# Patient Record
Sex: Male | Born: 1935 | Race: White | Hispanic: No | Marital: Married | Smoking: Never smoker
Health system: Southern US, Community
[De-identification: ages and names within clinical notes are randomized; demographics above are authoritative.]

## PROBLEM LIST (undated history)

## (undated) DIAGNOSIS — I2510 Atherosclerotic heart disease of native coronary artery without angina pectoris: Secondary | ICD-10-CM

## (undated) DIAGNOSIS — C801 Malignant (primary) neoplasm, unspecified: Secondary | ICD-10-CM

## (undated) DIAGNOSIS — I639 Cerebral infarction, unspecified: Secondary | ICD-10-CM

## (undated) DIAGNOSIS — I219 Acute myocardial infarction, unspecified: Secondary | ICD-10-CM

## (undated) HISTORY — PX: CHOLECYSTECTOMY: SHX55

## (undated) HISTORY — PX: COLON SURGERY: SHX602

## (undated) HISTORY — PX: REPLACEMENT TOTAL KNEE: SUR1224

---

## 2012-07-17 ENCOUNTER — Encounter (HOSPITAL_BASED_OUTPATIENT_CLINIC_OR_DEPARTMENT_OTHER): Payer: Self-pay | Admitting: *Deleted

## 2012-07-17 ENCOUNTER — Emergency Department (HOSPITAL_BASED_OUTPATIENT_CLINIC_OR_DEPARTMENT_OTHER)
Admission: EM | Admit: 2012-07-17 | Discharge: 2012-07-17 | Disposition: A | Discharge status: Home / Self Care | Payer: Medicare Other | Attending: Emergency Medicine | Admitting: Emergency Medicine

## 2012-07-17 ENCOUNTER — Emergency Department (HOSPITAL_BASED_OUTPATIENT_CLINIC_OR_DEPARTMENT_OTHER): Payer: Medicare Other

## 2012-07-17 DIAGNOSIS — I252 Old myocardial infarction: Secondary | ICD-10-CM | POA: Insufficient documentation

## 2012-07-17 DIAGNOSIS — W1809XA Striking against other object with subsequent fall, initial encounter: Secondary | ICD-10-CM | POA: Insufficient documentation

## 2012-07-17 DIAGNOSIS — R059 Cough, unspecified: Secondary | ICD-10-CM | POA: Insufficient documentation

## 2012-07-17 DIAGNOSIS — S81809A Unspecified open wound, unspecified lower leg, initial encounter: Secondary | ICD-10-CM | POA: Insufficient documentation

## 2012-07-17 DIAGNOSIS — S81011A Laceration without foreign body, right knee, initial encounter: Secondary | ICD-10-CM

## 2012-07-17 DIAGNOSIS — Z8673 Personal history of transient ischemic attack (TIA), and cerebral infarction without residual deficits: Secondary | ICD-10-CM | POA: Insufficient documentation

## 2012-07-17 DIAGNOSIS — Y92009 Unspecified place in unspecified non-institutional (private) residence as the place of occurrence of the external cause: Secondary | ICD-10-CM | POA: Insufficient documentation

## 2012-07-17 DIAGNOSIS — Z79899 Other long term (current) drug therapy: Secondary | ICD-10-CM | POA: Insufficient documentation

## 2012-07-17 DIAGNOSIS — Z859 Personal history of malignant neoplasm, unspecified: Secondary | ICD-10-CM | POA: Insufficient documentation

## 2012-07-17 DIAGNOSIS — Z96659 Presence of unspecified artificial knee joint: Secondary | ICD-10-CM | POA: Insufficient documentation

## 2012-07-17 DIAGNOSIS — R05 Cough: Secondary | ICD-10-CM

## 2012-07-17 DIAGNOSIS — Y9389 Activity, other specified: Secondary | ICD-10-CM | POA: Insufficient documentation

## 2012-07-17 DIAGNOSIS — S81009A Unspecified open wound, unspecified knee, initial encounter: Secondary | ICD-10-CM | POA: Insufficient documentation

## 2012-07-17 DIAGNOSIS — Z7902 Long term (current) use of antithrombotics/antiplatelets: Secondary | ICD-10-CM | POA: Insufficient documentation

## 2012-07-17 DIAGNOSIS — I2510 Atherosclerotic heart disease of native coronary artery without angina pectoris: Secondary | ICD-10-CM | POA: Insufficient documentation

## 2012-07-17 HISTORY — DX: Atherosclerotic heart disease of native coronary artery without angina pectoris: I25.10

## 2012-07-17 HISTORY — DX: Malignant (primary) neoplasm, unspecified: C80.1

## 2012-07-17 HISTORY — DX: Acute myocardial infarction, unspecified: I21.9

## 2012-07-17 HISTORY — DX: Cerebral infarction, unspecified: I63.9

## 2012-07-17 MED ORDER — HYDROCODONE-ACETAMINOPHEN 5-325 MG PO TABS: 2.0000 | ORAL_TABLET | Freq: Four times a day (QID) | ORAL | Status: DC | PRN

## 2012-07-17 MED ORDER — HYDROCODONE-ACETAMINOPHEN 5-325 MG PO TABS
1.0000 | ORAL_TABLET | Freq: Once | ORAL | Status: AC
  Administered 2012-07-17: 1 via ORAL
  Filled 2012-07-17: qty 1

## 2012-07-17 NOTE — ED Notes (Signed)
Pt resting comfortably. Waiting for xray. Bacitracin ointment applied to superficial wounds on hands and lower ext.

## 2012-07-17 NOTE — ED Notes (Signed)
Pt states he stumped his toe and fell. Now C/O bilateral knee pain. Lac to right knee. Abrasions to hands and legs. No LOC PERRL.

## 2012-07-17 NOTE — ED Notes (Signed)
Dr. Bernette Mayers suturing pt.

## 2012-07-17 NOTE — ED Provider Notes (Signed)
History  This chart was scribed for Charles B. Bernette Mayers, MD by Manuela Schwartz, ED scribe. This patient was seen in room MH07/MH07 and the patient's care was started at 1822.   CSN: [id number]  Arrival date & time 07/17/12  Rickey Primus   First MD Initiated Contact with Patient 07/17/12 1930      Chief Complaint  Patient presents with  . Fall   Patient is a 79 y.o. male presenting with fall. The history is provided by the patient. No language interpreter was used.  Fall This is a new problem. The current episode started 1 to 2 hours ago. The problem has not changed since onset.Pertinent negatives include no chest pain and no shortness of breath. Nothing aggravates the symptoms. Nothing relieves the symptoms. He has tried nothing for the symptoms.   HPI Comments: Gohan Collister is a 79 y.o. male who presents to the Emergency Department complaining of a laceration to his right knee after he fell on concrete at home just PTA. He states constant moderate pain to his right knee and denies any other injuries and no LOC. He has not tried any medicines at home for this problem. He states minimal bleeding and is able to bear weight on both legs.  Pt has also had cough for the last several days and family is requesting CXR.   Past Medical History  Diagnosis Date  . Coronary artery disease   . Myocardial infarct   . Stroke   . Cancer     Past Surgical History  Procedure Laterality Date  . Replacement total knee    . Colon surgery      History reviewed. No pertinent family history.  History  Substance Use Topics  . Smoking status: Never Smoker   . Smokeless tobacco: Not on file  . Alcohol Use: No      Review of Systems  Constitutional: Negative for fever and chills.  Respiratory: Negative for shortness of breath.   Cardiovascular: Negative for chest pain.  Gastrointestinal: Negative for nausea and vomiting.  Skin: Positive for wound (laceration right anterior knee).  Neurological: Negative  for dizziness, syncope and weakness.  All other systems reviewed and are negative.   A complete 10 system review of systems was obtained and all systems are negative except as noted in the HPI and PMH.   Allergies  Review of patient's allergies indicates no known allergies.  Home Medications   Current Outpatient Rx  Name  Route  Sig  Dispense  Refill  . clopidogrel (PLAVIX) 75 MG tablet   Oral   Take 75 mg by mouth daily.         Marland Kitchen escitalopram (LEXAPRO) 10 MG tablet   Oral   Take 10 mg by mouth daily.         Marland Kitchen HYDROcodone-acetaminophen (NORCO/VICODIN) 5-325 MG per tablet   Oral   Take 1 tablet by mouth every 6 (six) hours as needed for pain.           Triage Vitals: BP 118/67  Pulse 80  Temp(Src) 98.3 F (36.8 C) (Oral)  Resp 20  Ht 6' (1.829 m)  Wt 222 lb (100.699 kg)  BMI 30.1 kg/m2  SpO2 94%  Physical Exam  Nursing note and vitals reviewed. Constitutional: He is oriented to person, place, and time. He appears well-developed and well-nourished.  HENT:  Head: Normocephalic and atraumatic.  Eyes: EOM are normal. Pupils are equal, round, and reactive to light.  Neck: Normal range of motion. Neck  supple.  Cardiovascular: Normal rate, normal heart sounds and intact distal pulses.   Pulmonary/Chest: Effort normal.  Decreased breath sounds right base  Abdominal: Bowel sounds are normal. He exhibits no distension. There is no tenderness.  Musculoskeletal: Normal range of motion. He exhibits no edema and no tenderness.  Neurological: He is alert and oriented to person, place, and time. He has normal strength. No cranial nerve deficit or sensory deficit.  Skin: Skin is warm and dry. No rash noted.  6 cm laceration to anterior right knee  Psychiatric: He has a normal mood and affect.    ED Course  Procedures (including critical care time) DIAGNOSTIC STUDIES: Oxygen Saturation is 94% on room air, adequate by my interpretation.    LACERATION REPAIR Performed  by: Pollyann Savoy. Consent: Verbal consent obtained. Risks and benefits: risks, benefits and alternatives were discussed Patient identity confirmed: provided demographic data Time out performed prior to procedure Prepped and Draped in normal sterile fashion Wound explored Laceration Location: R knee Laceration Length: 6cm No Foreign Bodies seen or palpated Anesthesia: local infiltration Local anesthetic: lidocaine 2% no epinephrine Anesthetic total: 3 ml Irrigation method: syringe Amount of cleaning: extensive Skin closure: vicryl 5-0 internal x 2 for fascia closure, nylon 4-0 horizontal mattress x 5 for skin closure in high tension site Patient tolerance: Patient tolerated the procedure well with no immediate complications.   COORDINATION OF CARE: At 730 PM Discussed treatment plan with patient which includes pain medicine, laceration repair, CXR, and right knee X-ray. Patient agrees.   Labs Reviewed - No data to display Dg Chest 2 View  07/17/2012   *RADIOLOGY REPORT*  Clinical Data: Cough  CHEST - 2 VIEW  Comparison: None.  Findings: Hyperinflation with prominent lung markings diffusely suggestive of COPD and scarring.  No prior studies are available. Without the benefit of prior studies, it is difficult exclude acute infection however these findings are most likely due to scarring. There is apical pleural scarring bilaterally.  Negative for heart failure or effusion.  Chronic left rib fractures.  IMPRESSION: Findings are most consistent with chronic lung disease and scarring.  No prior studies available to determine baseline in this patient.   Original Report Authenticated By: Janeece Riggers, M.D.   Dg Knee Complete 4 Views Right  07/17/2012   *RADIOLOGY REPORT*  Clinical Data: Fall  RIGHT KNEE - COMPLETE 4+ VIEW  Comparison: None.  Findings: Total knee replacement in satisfactory position and alignment without evidence of loosening  Negative for fracture.  IMPRESSION: No acute  abnormality.   Original Report Authenticated By: Janeece Riggers, M.D.     1. Knee laceration, right, initial encounter   2. Cough       MDM  I personally performed the services described in this documentation, which was scribed in my presence. The recorded information has been reviewed and is accurate.           Charles B. Bernette Mayers, MD 07/17/12 2129

## 2012-07-31 ENCOUNTER — Encounter (HOSPITAL_BASED_OUTPATIENT_CLINIC_OR_DEPARTMENT_OTHER): Payer: Self-pay | Admitting: *Deleted

## 2012-07-31 ENCOUNTER — Emergency Department (HOSPITAL_BASED_OUTPATIENT_CLINIC_OR_DEPARTMENT_OTHER)
Admission: EM | Admit: 2012-07-31 | Discharge: 2012-07-31 | Disposition: A | Discharge status: Home / Self Care | Payer: Medicare Other | Attending: Emergency Medicine | Admitting: Emergency Medicine

## 2012-07-31 DIAGNOSIS — Z8673 Personal history of transient ischemic attack (TIA), and cerebral infarction without residual deficits: Secondary | ICD-10-CM | POA: Insufficient documentation

## 2012-07-31 DIAGNOSIS — I2510 Atherosclerotic heart disease of native coronary artery without angina pectoris: Secondary | ICD-10-CM | POA: Insufficient documentation

## 2012-07-31 DIAGNOSIS — I252 Old myocardial infarction: Secondary | ICD-10-CM | POA: Insufficient documentation

## 2012-07-31 DIAGNOSIS — T8140XA Infection following a procedure, unspecified, initial encounter: Secondary | ICD-10-CM | POA: Insufficient documentation

## 2012-07-31 DIAGNOSIS — Z859 Personal history of malignant neoplasm, unspecified: Secondary | ICD-10-CM | POA: Insufficient documentation

## 2012-07-31 DIAGNOSIS — Z79899 Other long term (current) drug therapy: Secondary | ICD-10-CM | POA: Insufficient documentation

## 2012-07-31 DIAGNOSIS — Z4802 Encounter for removal of sutures: Secondary | ICD-10-CM | POA: Insufficient documentation

## 2012-07-31 DIAGNOSIS — Z96659 Presence of unspecified artificial knee joint: Secondary | ICD-10-CM | POA: Insufficient documentation

## 2012-07-31 DIAGNOSIS — Y849 Medical procedure, unspecified as the cause of abnormal reaction of the patient, or of later complication, without mention of misadventure at the time of the procedure: Secondary | ICD-10-CM | POA: Insufficient documentation

## 2012-07-31 DIAGNOSIS — T798XXA Other early complications of trauma, initial encounter: Secondary | ICD-10-CM

## 2012-07-31 DIAGNOSIS — Z7902 Long term (current) use of antithrombotics/antiplatelets: Secondary | ICD-10-CM | POA: Insufficient documentation

## 2012-07-31 MED ORDER — CEPHALEXIN 500 MG PO CAPS: 500.0000 mg | ORAL_CAPSULE | Freq: Two times a day (BID) | ORAL | Status: DC

## 2012-07-31 NOTE — ED Notes (Signed)
Needs suture removal right knee

## 2012-07-31 NOTE — ED Provider Notes (Signed)
History    CSN: [id number] Arrival date & time 07/31/12  1518  First MD Initiated Contact with Patient 07/31/12 1552     Chief Complaint  Patient presents with  . Suture / Staple Removal   (Consider location/radiation/quality/duration/timing/severity/associated sxs/prior Treatment) HPI Jerry Mcfarland is a 79 y.o. male who presents to ED with complaint of needing suture removal. Pt states he fell 2 wks ago onto right knee. Reports laceration that he had sutured here in ED. Here for reevaluation and suture removal. Admits to mild redness and swelling around the laceration. Denies tenderness, no drainage, no fever, chills. No pain with walking or knee ROM. Pt states he has not been prescribed any antibiotics and has not been putting any antibiotics on.   Past Medical History  Diagnosis Date  . Coronary artery disease   . Myocardial infarct   . Stroke   . Cancer    Past Surgical History  Procedure Laterality Date  . Replacement total knee    . Colon surgery     No family history on file. History  Substance Use Topics  . Smoking status: Never Smoker   . Smokeless tobacco: Not on file  . Alcohol Use: No    Review of Systems  Constitutional: Negative for fever and chills.  Skin: Positive for color change and wound.  Neurological: Negative for weakness and numbness.    Allergies  Review of patient's allergies indicates no known allergies.  Home Medications   Current Outpatient Rx  Name  Route  Sig  Dispense  Refill  . clopidogrel (PLAVIX) 75 MG tablet   Oral   Take 75 mg by mouth daily.         Marland Kitchen escitalopram (LEXAPRO) 10 MG tablet   Oral   Take 10 mg by mouth daily.         Marland Kitchen HYDROcodone-acetaminophen (NORCO/VICODIN) 5-325 MG per tablet   Oral   Take 1 tablet by mouth every 6 (six) hours as needed for pain.         Marland Kitchen HYDROcodone-acetaminophen (NORCO/VICODIN) 5-325 MG per tablet   Oral   Take 2 tablets by mouth every 6 (six) hours as needed for pain.  30 tablet   0    BP 100/57  Pulse 110  Temp(Src) 98.3 F (36.8 C) (Oral)  Resp 16  Ht 6' (1.829 m)  Wt 222 lb (100.699 kg)  BMI 30.1 kg/m2  SpO2 96% Physical Exam  Nursing note and vitals reviewed. Constitutional: He appears well-developed and well-nourished. No distress.  HENT:  Head: Normocephalic.  Eyes: Conjunctivae are normal. Pupils are equal, round, and reactive to light.  Pulmonary/Chest: Effort normal.  Musculoskeletal:  Full rom of right knee  Skin: Skin is warm and dry.  Healing laceration to anterior knee with scabbing. Surrounding erythema is present. No drainage, no dehiscence, no tenderness    ED Course  Procedures (including critical care time) Labs Reviewed - No data to display No results found. No diagnosis found.  MDM  Pt with right knee laceration 14 days ago, healing well. Suture removed, 5. Suspicious for possible mild surrounding cellulitis given erythema. NO pain with knee rom doubt septic joint. Will start on keflex. Follow up with pcp.   Filed Vitals:   07/31/12 1550 07/31/12 1656  BP: 100/57 107/71  Pulse: 110 107  Temp: 98.3 F (36.8 C) 98.3 F (36.8 C)  TempSrc: Oral Oral  Resp: 16 16  Height: 6' (1.829 m)   Weight: 222 lb (100.699  kg)   SpO2: 96% 94%     Lottie Mussel, PA-C 08/01/12 0028

## 2012-08-02 NOTE — ED Provider Notes (Signed)
Medical screening examination/treatment/procedure(s) were performed by non-physician practitioner and as supervising physician I was immediately available for consultation/collaboration.   Rolan Bucco, MD 08/02/12 [phone]

## 2014-11-07 IMAGING — CR DG CHEST 2V
2 series · 2 of 2 positions shown · non-contrast
Comparison: None.

CLINICAL DATA: Cough

CHEST - 2 VIEW

[w chest pa]
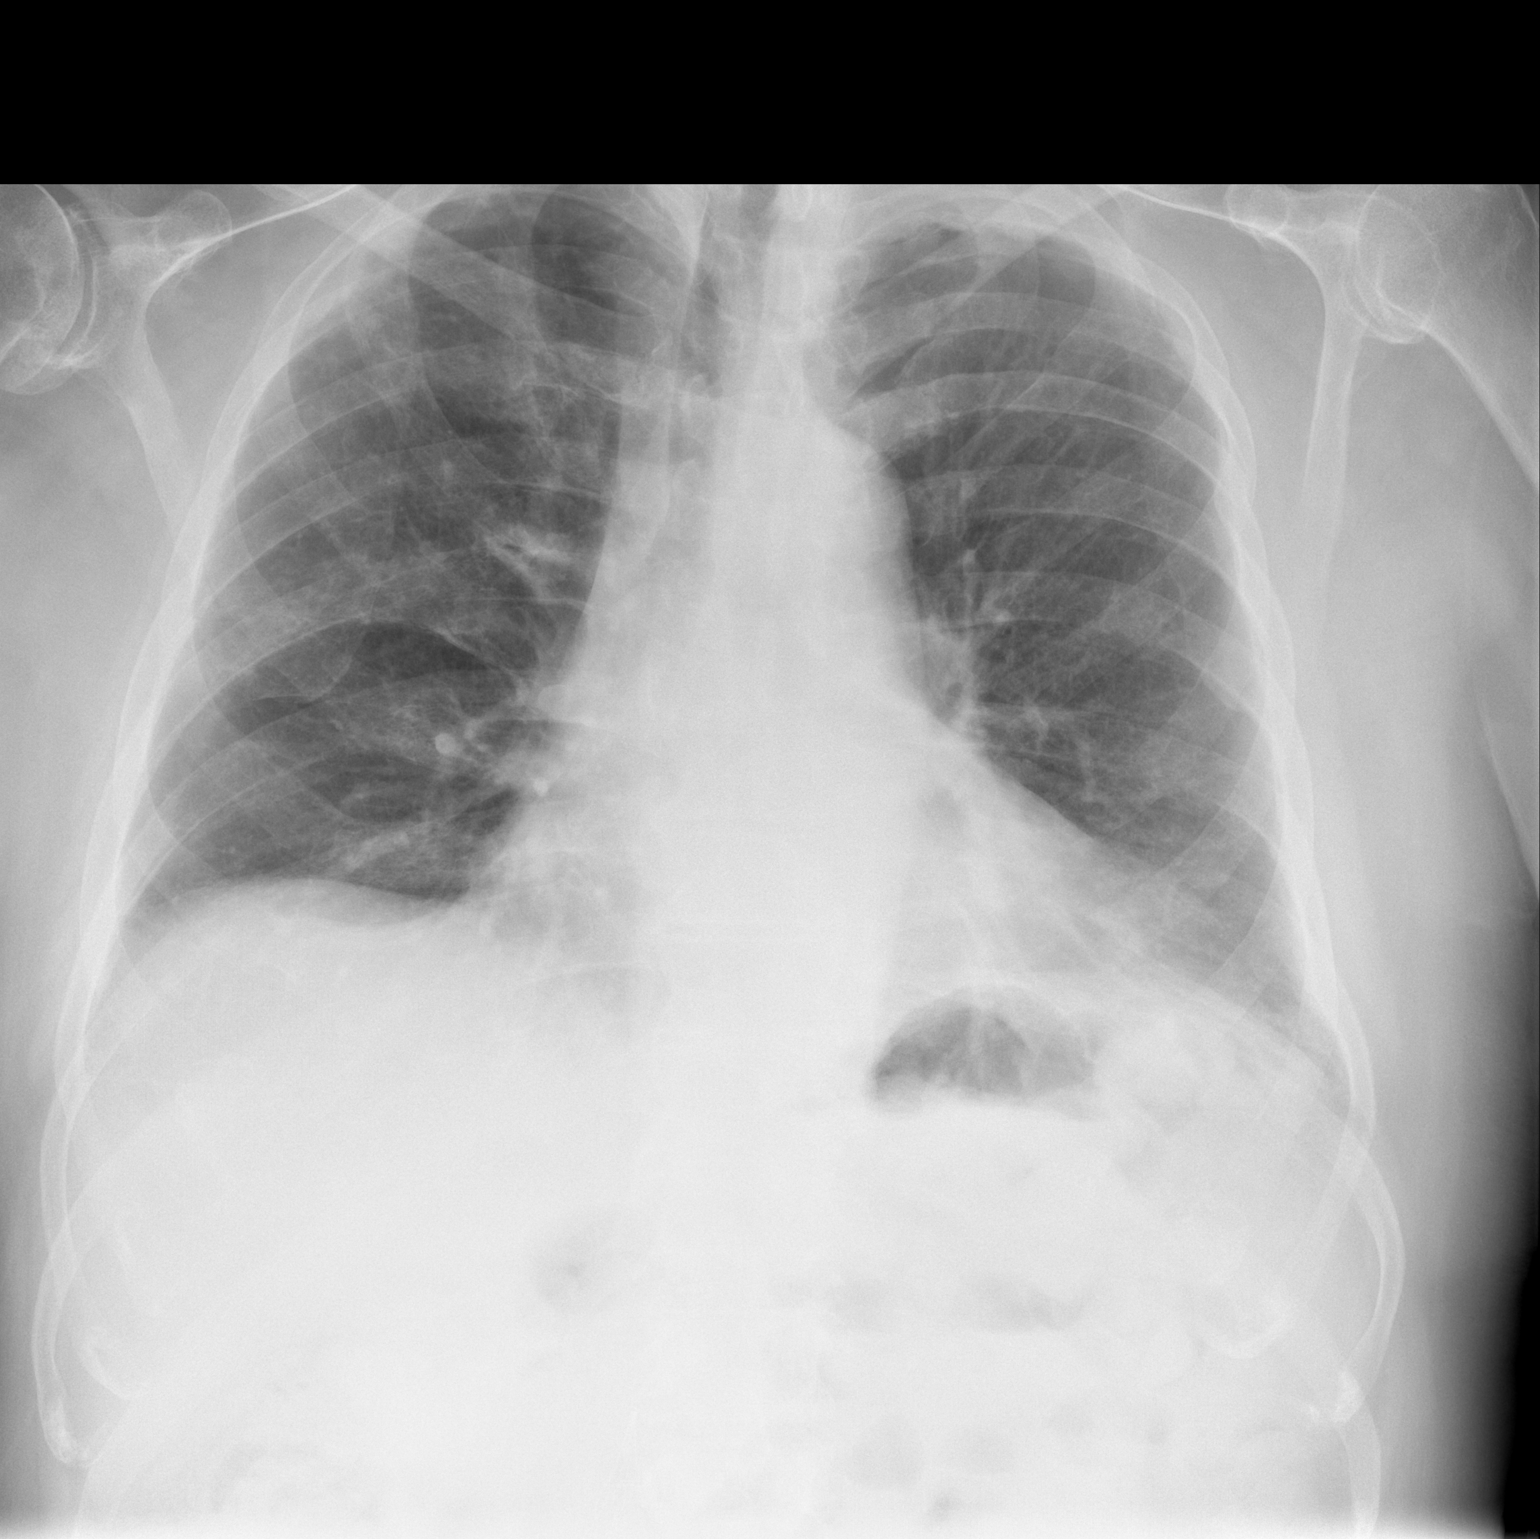

[w chest lat]
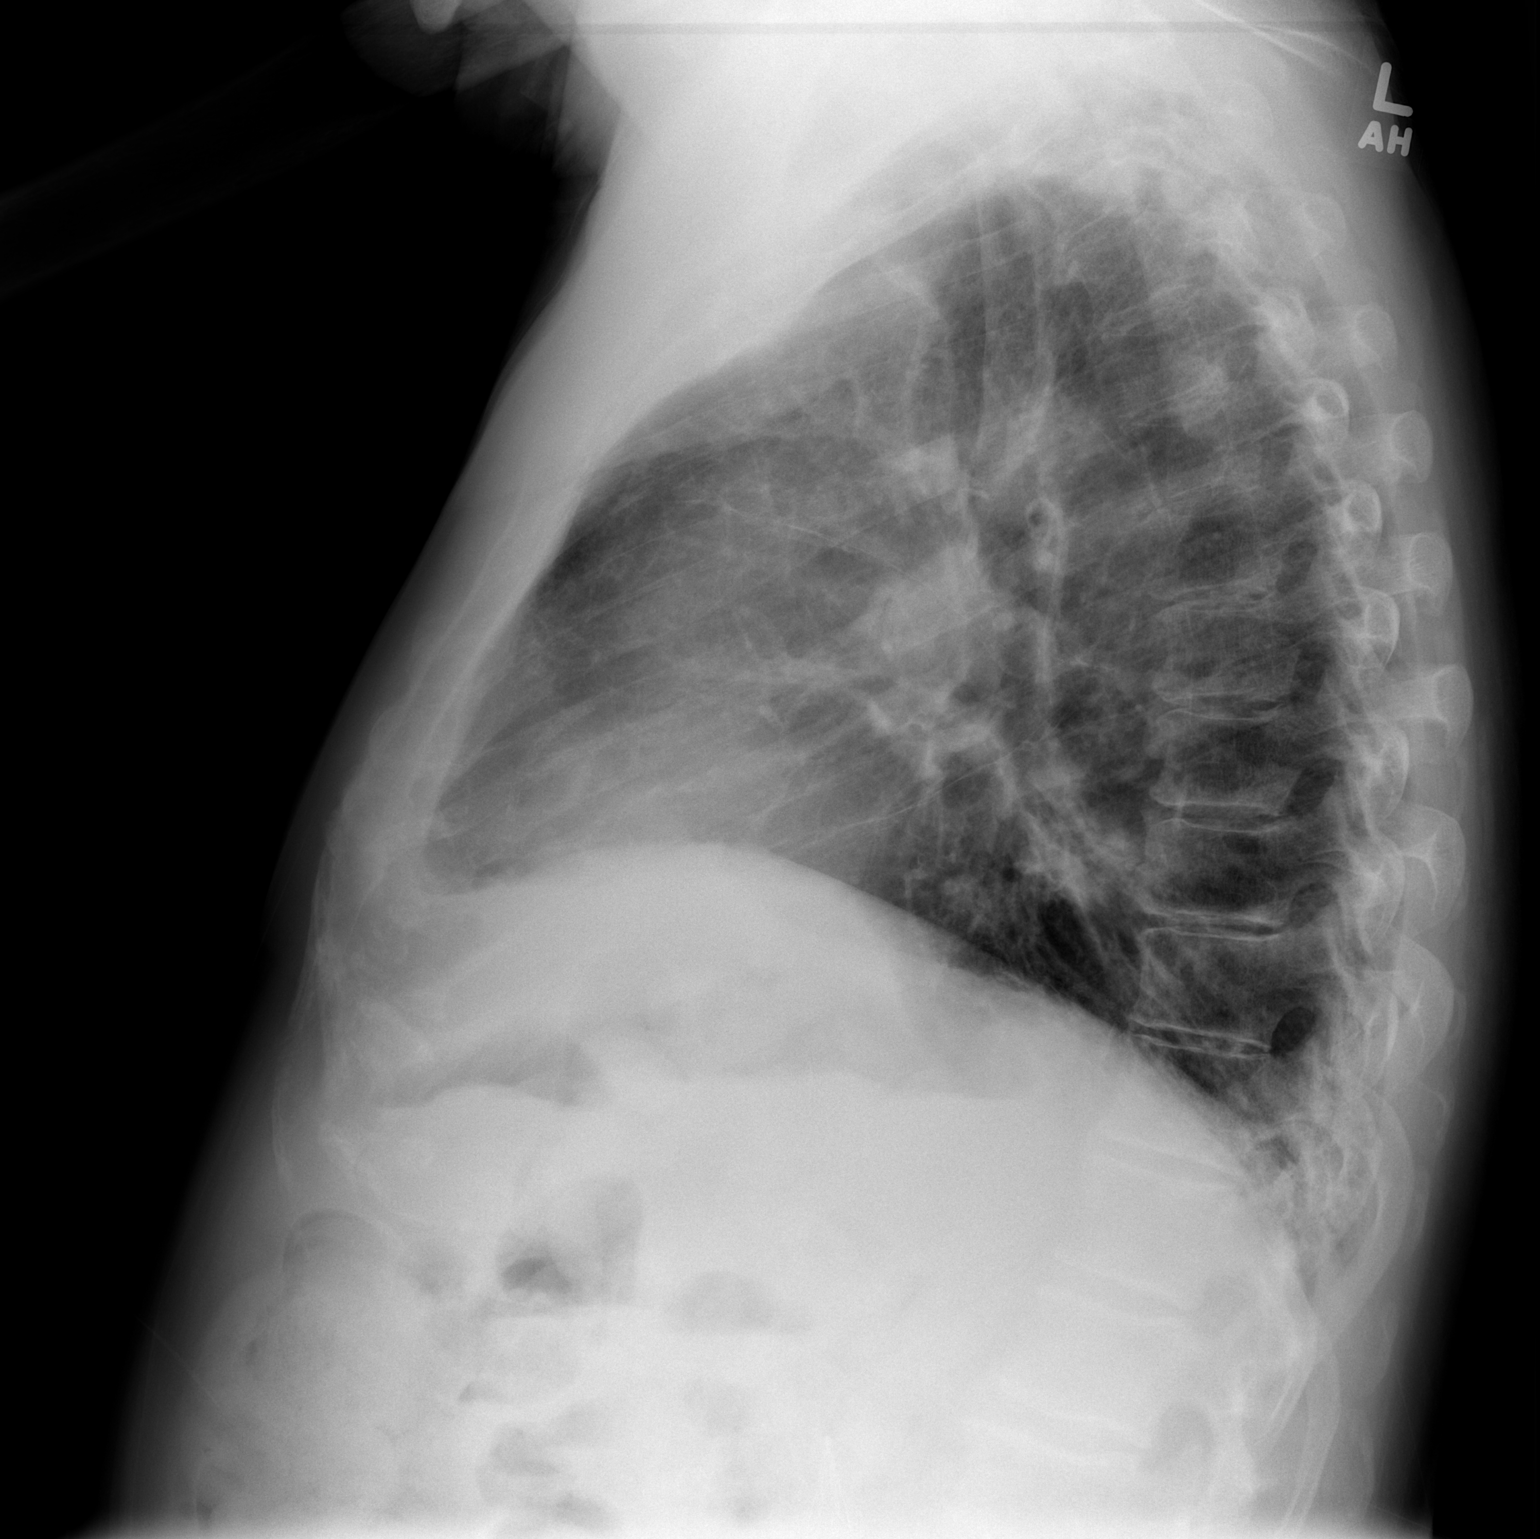

[2 of 2 positions shown; findings below may reference images not displayed]

FINDINGS: Hyperinflation with prominent lung markings diffusely
suggestive of COPD and scarring.  No prior studies are available.
Without the benefit of prior studies, it is difficult exclude acute
infection however these findings are most likely due to scarring.
There is apical pleural scarring bilaterally.  Negative for heart
failure or effusion.

Chronic left rib fractures.
IMPRESSION: Findings are most consistent with chronic lung disease and
scarring.  No prior studies available to determine baseline in this
patient.

## 2015-07-14 ENCOUNTER — Encounter (HOSPITAL_BASED_OUTPATIENT_CLINIC_OR_DEPARTMENT_OTHER): Payer: Self-pay | Admitting: *Deleted

## 2015-07-14 ENCOUNTER — Emergency Department (HOSPITAL_BASED_OUTPATIENT_CLINIC_OR_DEPARTMENT_OTHER): Payer: Medicare Other

## 2015-07-14 ENCOUNTER — Emergency Department (HOSPITAL_BASED_OUTPATIENT_CLINIC_OR_DEPARTMENT_OTHER)
Admission: EM | Admit: 2015-07-14 | Discharge: 2015-07-14 | Disposition: A | Discharge status: Home / Self Care | Payer: Medicare Other | Attending: Emergency Medicine | Admitting: Emergency Medicine

## 2015-07-14 DIAGNOSIS — S83105A Unspecified dislocation of left knee, initial encounter: Secondary | ICD-10-CM

## 2015-07-14 DIAGNOSIS — S0990XA Unspecified injury of head, initial encounter: Secondary | ICD-10-CM | POA: Diagnosis present

## 2015-07-14 DIAGNOSIS — S51012A Laceration without foreign body of left elbow, initial encounter: Secondary | ICD-10-CM | POA: Diagnosis not present

## 2015-07-14 DIAGNOSIS — S0083XA Contusion of other part of head, initial encounter: Secondary | ICD-10-CM | POA: Insufficient documentation

## 2015-07-14 DIAGNOSIS — Z79899 Other long term (current) drug therapy: Secondary | ICD-10-CM | POA: Diagnosis not present

## 2015-07-14 DIAGNOSIS — I252 Old myocardial infarction: Secondary | ICD-10-CM | POA: Diagnosis not present

## 2015-07-14 DIAGNOSIS — I2510 Atherosclerotic heart disease of native coronary artery without angina pectoris: Secondary | ICD-10-CM | POA: Insufficient documentation

## 2015-07-14 DIAGNOSIS — Y999 Unspecified external cause status: Secondary | ICD-10-CM | POA: Diagnosis not present

## 2015-07-14 DIAGNOSIS — Y929 Unspecified place or not applicable: Secondary | ICD-10-CM | POA: Insufficient documentation

## 2015-07-14 DIAGNOSIS — S41012A Laceration without foreign body of left shoulder, initial encounter: Secondary | ICD-10-CM | POA: Diagnosis not present

## 2015-07-14 DIAGNOSIS — Y939 Activity, unspecified: Secondary | ICD-10-CM | POA: Insufficient documentation

## 2015-07-14 DIAGNOSIS — W101XXA Fall (on)(from) sidewalk curb, initial encounter: Secondary | ICD-10-CM | POA: Diagnosis not present

## 2015-07-14 DIAGNOSIS — Z8673 Personal history of transient ischemic attack (TIA), and cerebral infarction without residual deficits: Secondary | ICD-10-CM | POA: Diagnosis not present

## 2015-07-14 DIAGNOSIS — S81002A Unspecified open wound, left knee, initial encounter: Secondary | ICD-10-CM | POA: Insufficient documentation

## 2015-07-14 MED ORDER — CEPHALEXIN 500 MG PO CAPS: 500.0000 mg | ORAL_CAPSULE | Freq: Four times a day (QID) | ORAL | Status: AC

## 2015-07-14 MED ORDER — HYDROCODONE-ACETAMINOPHEN 5-325 MG PO TABS
1.0000 | ORAL_TABLET | Freq: Once | ORAL | Status: AC
  Administered 2015-07-14: 1 via ORAL
  Filled 2015-07-14: qty 1

## 2015-07-14 MED ORDER — HYDROCODONE-ACETAMINOPHEN 5-325 MG PO TABS: 1.0000 | ORAL_TABLET | Freq: Four times a day (QID) | ORAL | Status: AC | PRN

## 2015-07-14 NOTE — ED Notes (Signed)
Pt reports that he fell off a curb.  Denies LOC.  Pt hit his head, bilateral knees.  pts left knee has a significant avulsion with laceration-no bleeding noted in triage.

## 2015-07-14 NOTE — ED Notes (Signed)
Pt alert, NAD, calm, interactive, quick-witted, c/o pain to head and L knee, (denies: nv, dizziness, LOC, neck or back pain, sob or other sx), family x2 at [facility], pt to CT/ x-ray via stretcher.

## 2015-07-14 NOTE — ED Notes (Signed)
Dr. Dina Rich at [facility], into room prior to RN assessment, see MD notes, pending orders.

## 2015-07-14 NOTE — Discharge Instructions (Signed)
You were seen today after fall. You have a large deep avulsion to the left knee. Given that it was 10 hours after your fall, there is high risk for infection. This was not closed. It will need to be dressed and watch closely for infection. Follow-up with orthopedist.  Hematoma A hematoma is a collection of blood under the skin, in an organ, in a body space, in a joint space, or in other tissue. The blood can clot to form a lump that you can see and feel. The lump is often firm and may sometimes become sore and tender. Most hematomas get better in a few days to weeks. However, some hematomas may be serious and require medical care. Hematomas can range in size from very small to very large. CAUSES  A hematoma can be caused by a blunt or penetrating injury. It can also be caused by spontaneous leakage from a blood vessel under the skin. Spontaneous leakage from a blood vessel is more likely to occur in older people, especially those taking blood thinners. Sometimes, a hematoma can develop after certain medical procedures. SIGNS AND SYMPTOMS   A firm lump on the body.  Possible pain and tenderness in the area.  Bruising.Blue, dark blue, purple-red, or yellowish skin may appear at the site of the hematoma if the hematoma is close to the surface of the skin. For hematomas in deeper tissues or body spaces, the signs and symptoms may be subtle. For example, an intra-abdominal hematoma may cause abdominal pain, weakness, fainting, and shortness of breath. An intracranial hematoma may cause a headache or symptoms such as weakness, trouble speaking, or a change in consciousness. DIAGNOSIS  A hematoma can usually be diagnosed based on your medical history and a physical exam. Imaging tests may be needed if your health care provider suspects a hematoma in deeper tissues or body spaces, such as the abdomen, head, or chest. These tests may include ultrasonography or a CT scan.  TREATMENT  Hematomas usually go  away on their own over time. Rarely does the blood need to be drained out of the body. Large hematomas or those that may affect vital organs will sometimes need surgical drainage or monitoring. HOME CARE INSTRUCTIONS   Apply ice to the injured area:   Put ice in a plastic bag.   Place a towel between your skin and the bag.   Leave the ice on for 20 minutes, 2-3 times a day for the first 1 to 2 days.   After the first 2 days, switch to using warm compresses on the hematoma.   Elevate the injured area to help decrease pain and swelling. Wrapping the area with an elastic bandage may also be helpful. Compression helps to reduce swelling and promotes shrinking of the hematoma. Make sure the bandage is not wrapped too tight.   If your hematoma is on a lower extremity and is painful, crutches may be helpful for a couple days.   Only take over-the-counter or prescription medicines as directed by your health care provider. SEEK IMMEDIATE MEDICAL CARE IF:   You have increasing pain, or your pain is not controlled with medicine.   You have a fever.   You have worsening swelling or discoloration.   Your skin over the hematoma breaks or starts bleeding.   Your hematoma is in your chest or abdomen and you have weakness, shortness of breath, or a change in consciousness.  Your hematoma is on your scalp (caused by a fall or injury)  and you have a worsening headache or a change in alertness or consciousness. MAKE SURE YOU:   Understand these instructions.  Will watch your condition.  Will get help right away if you are not doing well or get worse.   This information is not intended to replace advice given to you by your health care provider. Make sure you discuss any questions you have with your health care provider.   Document Released: 09/03/2003 Document Revised: 09/21/2012 Document Reviewed: 06/29/2012 Elsevier Interactive Patient Education 2016 [employer] Skin Tear  Care A skin tear is a wound in which the top layer of skin has peeled off. This is a common problem with aging because the skin becomes thinner and more fragile as a person gets older. In addition, some medicines, such as oral corticosteroids, can lead to skin thinning if taken for long periods of time.  A skin tear is often repaired with tape or skin adhesive strips. This keeps the skin that has been peeled off in contact with the healthier skin beneath. Depending on the location of the wound, a bandage (dressing) may be applied over the tape or skin adhesive strips. Sometimes, during the healing process, the skin turns black and dies. Even when this happens, the torn skin acts as a good dressing until the skin underneath gets healthier and repairs itself. HOME CARE INSTRUCTIONS   Change dressings once per day or as directed by your caregiver.  Gently clean the skin tear and the area around the tear using saline solution or mild soap and water.  Do not rub the injured skin dry. Let the area air dry.  Apply petroleum jelly or an antibiotic cream or ointment to keep the tear moist. This will help the wound heal. Do not allow a scab to form.  If the dressing sticks before the next dressing change, moisten it with warm soapy water and gently remove it.  Protect the injured skin until it has healed.  Only take over-the-counter or prescription medicines as directed by your caregiver.  Take showers or baths using warm soapy water. Apply a new dressing after the shower or bath.  Keep all follow-up appointments as directed by your caregiver.  SEEK IMMEDIATE MEDICAL CARE IF:   You have redness, swelling, or increasing pain in the skin tear.  You havepus coming from the skin tear.  You have chills.  You have a red streak that goes away from the skin tear.  You have a bad smell coming from the tear or dressing.  You have a fever or persistent symptoms for more than 2-3 days.  You have a  fever and your symptoms suddenly get worse. MAKE SURE YOU:  Understand these instructions.  Will watch this condition.  Will get help right away if your child is not doing well or gets worse.   This information is not intended to replace advice given to you by your health care provider. Make sure you discuss any questions you have with your health care provider.   Document Released: 10/14/2000 Document Revised: 10/14/2011 Document Reviewed: 08/03/2011 Elsevier Interactive Patient Education 2016 [place] [employer].   Deep Skin Avulsion A deep skin avulsion is a type of open wound. It often results from a severe injury (trauma) that tears away all layers of the skin or an entire body part. The areas of the body that are most often affected by a deep skin avulsion include the face, lips, ears, nose, and fingers. A deep skin avulsion may make  structures below the skin become visible. You may be able to see muscle, bone, nerves, and blood vessels. A deep skin avulsion can also damage important structures beneath the skin. These include tendons, ligaments, nerves, or blood vessels. CAUSES Injuries that often cause a deep skin avulsion include:  Being crushed.  Falling against a jagged surface.  Animal bites.  Gunshot wounds.  Severe burns.  Injuries that involve being dragged, such as bicycle or motorcycle accidents. SYMPTOMS Symptoms of a deep skin avulsion include:  Pain.  Numbness.  Swelling.  A misshapen body part.  Bleeding, which may be heavy.  Fluid leaking from the wound. DIAGNOSIS This condition may be diagnosed with a medical history and physical exam. You may also have X-rays done. TREATMENT The treatment that is chosen for a deep skin avulsion depends on how large and deep the wound is and where it is located. Treatment for all types of avulsions usually starts with:  Controlling the bleeding.  Washing out the wound with a germ-free (sterile) salt-water  solution.  Removing dead tissue from the wound. A wound may be closed or left open to heal. This depends on the size and location of the wound and whether it is likely to become infected. Wounds are usually covered or closed if they expose blood vessels, nerves, bone, or cartilage.  Wounds that are small and clean may be closed with stitches (sutures).  Wounds that cannot be closed with sutures may be covered with a piece of skin (graft) or a skin flap. Skin may be taken from on or near the wound, from another part of the body, or from a donor.  Wounds may be left open if they are hard to close or they may become infected. These wounds heal over time from the bottom up. You may also receive medicine. This may include:  Antibiotics.  A tetanus shot.  Rabies vaccine. HOME CARE INSTRUCTIONS Medicines  Take or apply over-the-counter and prescription medicines only as told by your health care provider.  If you were prescribed an antibiotic, take or apply it as told by your health care provider. Do not stop taking the antibiotic even if your condition improves.  You may get anti-itch medicine while your wound is healing. Use it only as told by your health care provider. Wound Care  There are many ways to close and cover a wound. For example, a wound can be covered with sutures, skin glue, or adhesive strips. Follow instructions from your health care provider about:  How to take care of your wound.  When and how you should change your bandage (dressing).  When you should remove your dressing.  Removing whatever was used to close your wound.  Keep the dressing dry as told by your health care provider. Do not take baths, swim, use a hot tub, or do anything that would put your wound underwater until your health care provider approves.  Clean the wound each day or as told by your health care provider.  Wash the wound with mild soap and water.  Rinse the wound with water to remove all  soap.  Pat the wound dry with a clean towel. Do not rub it.  Do not scratch or pick at the wound.  Check your wound every day for signs of infection. Watch for:  Redness, swelling, or pain.  Fluid, blood, or pus. General Instructions  Raise (elevate) the injured area above the level of your heart while you are sitting or lying down.  Keep all follow-up visits as told by your health care provider. This is important. SEEK MEDICAL CARE IF:  You received a tetanus shot and you have swelling, severe pain, redness, or bleeding at the injection site.  You have a fever.  Your pain is not controlled with medicine.  You have increased redness, swelling, or pain at the site of your wound.  You have fluid, blood, or pus coming from your wound.  You notice a bad smell coming from your wound or your dressing.  A wound that was closed breaks open.  You notice something coming out of the wound, such as wood or glass.  You notice a change in the color of your skin near your wound.  You develop a new rash.  You need to change the dressing frequently due to fluid, blood, or pus draining from the wound. SEEK IMMEDIATE MEDICAL CARE IF:  Your pain suddenly increases and is severe.  You develop severe swelling around the wound.  You develop numbness around the wound.  You have nausea and vomiting that does not go away after 24 hours.  You feel light-headed, weak, or faint.  You develop chest pain.  You have trouble breathing.  Your wound is on your hand or foot and you cannot properly move a finger or toe.  The wound is on your hand or foot and you notice that your fingers or toes look pale or bluish.  You have a red streak going away from your wound.   This information is not intended to replace advice given to you by your health care provider. Make sure you discuss any questions you have with your health care provider.   Document Released: 03/17/2006 Document Revised:  06/05/2014 Document Reviewed: 01/24/2014 Elsevier Interactive Patient Education [employer].

## 2015-07-14 NOTE — ED Notes (Signed)
Back from radiology.

## 2015-07-14 NOTE — ED Provider Notes (Signed)
CSN: [id number]     Arrival date & time 07/14/15  0042 History   First MD Initiated Contact with Patient 07/14/15 0104     Chief Complaint  Patient presents with  . Fall     (Consider location/radiation/quality/duration/timing/severity/associated sxs/prior Treatment) HPI  This is an 82 year old male with a history of coronary artery disease and CVA who presents following a fall. Patient reports that he sustained a fall at approximately 3:30 PM yesterday. He describes a mechanical fall where he tripped off a curb. He hit his left knee on the pavement and struck his head on the car. Denies loss of consciousness. He is on Plavix. EMS was called, but patient refused transport for further evaluation. Wounds were dressed at the scene. Per the patient, his "wife made me come" because his left knee wound looked so deep. Also reports some skin tears to the left shoulder. He has been ambulatory. Reports that tetanus is up-to-date.  Past Medical History  Diagnosis Date  . Coronary artery disease   . Myocardial infarct ([place])   . Stroke ([place])   . Cancer [facility])    Past Surgical History  Procedure Laterality Date  . Replacement total knee    . Colon surgery    . Cholecystectomy     History reviewed. No pertinent family history. Social History  Substance Use Topics  . Smoking status: Never Smoker   . Smokeless tobacco: None  . Alcohol Use: No    Review of Systems  Respiratory: Negative for shortness of breath.   Cardiovascular: Negative for chest pain.  Musculoskeletal: Positive for joint swelling. Negative for neck pain.  Skin: Positive for wound.  All other systems reviewed and are negative.     Allergies  Review of patient's allergies indicates no known allergies.  Home Medications   Prior to Admission medications   Medication Sig Start Date End Date Taking? Authorizing Provider  oxyCODONE-acetaminophen (PERCOCET) 10-325 MG tablet Take 1 tablet by mouth every 4 (four) hours as  needed for pain.   Yes Historical Provider, MD  traMADol (ULTRAM) 50 MG tablet Take by mouth every 6 (six) hours as needed.   Yes Historical Provider, MD  cephALEXin (KEFLEX) 500 MG capsule Take 1 capsule (500 mg total) by mouth 4 (four) times daily. 07/14/15   Merryl Hacker, MD  clopidogrel (PLAVIX) 75 MG tablet Take 75 mg by mouth daily.    Historical Provider, MD  escitalopram (LEXAPRO) 10 MG tablet Take 10 mg by mouth daily.    Historical Provider, MD  HYDROcodone-acetaminophen (NORCO/VICODIN) 5-325 MG tablet Take 1 tablet by mouth every 6 (six) hours as needed for moderate pain. 07/14/15   Merryl Hacker, MD   BP 105/90 mmHg  Pulse 104  Temp(Src) 98 F (36.7 C) (Oral)  Resp 20  Ht 6' (1.829 m)  Wt 200 lb (90.719 kg)  BMI 27.12 kg/m2  SpO2 97% Physical Exam  Constitutional: He is oriented to person, place, and time.  Elderly, no acute distress  HENT:  Head: Normocephalic.  Mouth/Throat: Oropharynx is clear and moist.  Hematoma noted to the forehead and left eye  Eyes: Pupils are equal, round, and reactive to light.  Neck: Normal range of motion. Neck supple.  No midline C-spine tenderness  Cardiovascular: Normal rate, regular rhythm and normal heart sounds.   No murmur heard. Pulmonary/Chest: Effort normal and breath sounds normal. No respiratory distress. He has no wheezes.  Abdominal: Soft. Bowel sounds are normal. There is no tenderness. There is  no rebound.  Musculoskeletal: He exhibits edema.  There is a 3 cm circumferential deep avulsion of the anterior aspect of the left knee, bleeding controlled, normal range of motion of the left knee, 2 superficial skin tears noted to the left shoulder normal range of motion, no obvious deformities  Neurological: He is alert and oriented to person, place, and time.  Skin: Skin is warm.  Mild erythema bilateral lower extremities mid tibia distally, mild weeping noted bilaterally  Psychiatric: He has a normal mood and affect.   Nursing note and vitals reviewed.   ED Course  Procedures (including critical care time) Labs Review Labs Reviewed - No data to display  Imaging Review Ct Head Wo Contrast  07/14/2015  CLINICAL DATA:  Status post fall, with scalp hematoma at the left frontoparietal region. Initial encounter. EXAM: CT HEAD WITHOUT CONTRAST TECHNIQUE: Contiguous axial images were obtained from the base of the skull through the vertex without intravenous contrast. COMPARISON:  CT of the head performed 10/14/2014 FINDINGS: There is no evidence of acute infarction, mass lesion, or intra- or extra-axial hemorrhage on CT. Prominence of the ventricles and sulci reflects moderate cortical volume loss. Scattered periventricular and subcortical white matter change likely reflects small vessel ischemic microangiopathy. Chronic lacunar infarcts are seen in the basal ganglia bilaterally. Mild cerebellar atrophy is noted. The brainstem and fourth ventricle are within normal limits. The cerebral hemispheres demonstrate grossly normal gray-white differentiation. No mass effect or midline shift is seen. There is no evidence of fracture; visualized osseous structures are unremarkable in appearance. The orbits are within normal limits. The paranasal sinuses and mastoid air cells are well-aerated. Soft tissue swelling is noted overlying the left frontal calvarium. IMPRESSION: 1. No evidence of traumatic intracranial injury or fracture. 2. Moderate cortical volume loss and scattered small vessel ischemic microangiopathy. 3. Chronic lacunar infarcts at the basal ganglia bilaterally. 4. Soft tissue swelling overlying the left frontal calvarium. Electronically Signed   By: Garald Balding M.D.   On: 07/14/2015 02:16   Dg Knee Complete 4 Views Left  07/14/2015  CLINICAL DATA:  82 year old male with fall and left knee pain. EXAM: LEFT KNEE - COMPLETE 4+ VIEW COMPARISON:  None. FINDINGS: There is a total left knee arthroplasty in anatomic  alignment. There is no evidence of hardware loosening. The bones are osteopenic. There is no acute fracture or dislocation. No joint effusion. The soft tissues are unremarkable. IMPRESSION: No acute fracture or dislocation. Total knee arthroplasty in anatomic alignment. Electronically Signed   By: Anner Crete M.D.   On: 07/14/2015 02:01   I have personally reviewed and evaluated these images and lab results as part of my medical decision-making.   EKG Interpretation None      MDM   Final diagnoses:  Knee avulsion, left, initial encounter  Skin tear of elbow without complication, left, initial encounter  Traumatic hematoma of forehead, initial encounter    Patient presents following a fall. He presents almost 10 hours after the fall with obvious head trauma and left knee trauma. He is on blood thinners. The deep avulsion to the left knee is full thickness and underlying structures visualized. It does not appear to communicate with the joint. He is status post total knee replacement. Plain films are negative for acute fracture. CT head is also negative. Patient is at high risk for infection. At this time I do not feel that primary closure is an option given time from injury as well as the need for tight closure  given that it over the knee and will be exposed to flexion forces.  The wound was copiously irrigated with water and Betadine.  It was dressed with bacitracin and Vaseline gall and a nonadherent dressing. We'll have patient follow-up with orthopedist. His primary orthopedist no longer works in town. Will give him the on-call provider number. Patient is awake, alert, and oriented. No other obvious injuries and head CT is negative. Will discharge home with family. Patient was given Keflex for prophylaxis.  After history, exam, and medical workup I feel the patient has been appropriately medically screened and is safe for discharge home. Pertinent diagnoses were discussed with the patient.  Patient was given return precautions.     Merryl Hacker, MD 07/14/15 [id number]

## 2017-02-02 DEATH — deceased

## 2017-11-03 IMAGING — DX DG KNEE COMPLETE 4+V*L*
4 series · 4 of 4 positions shown · non-contrast
Comparison: None.

CLINICAL DATA: 83-year-old male with fall and left knee pain.

EXAM:
LEFT KNEE - COMPLETE 4+ VIEW

[knee ap]
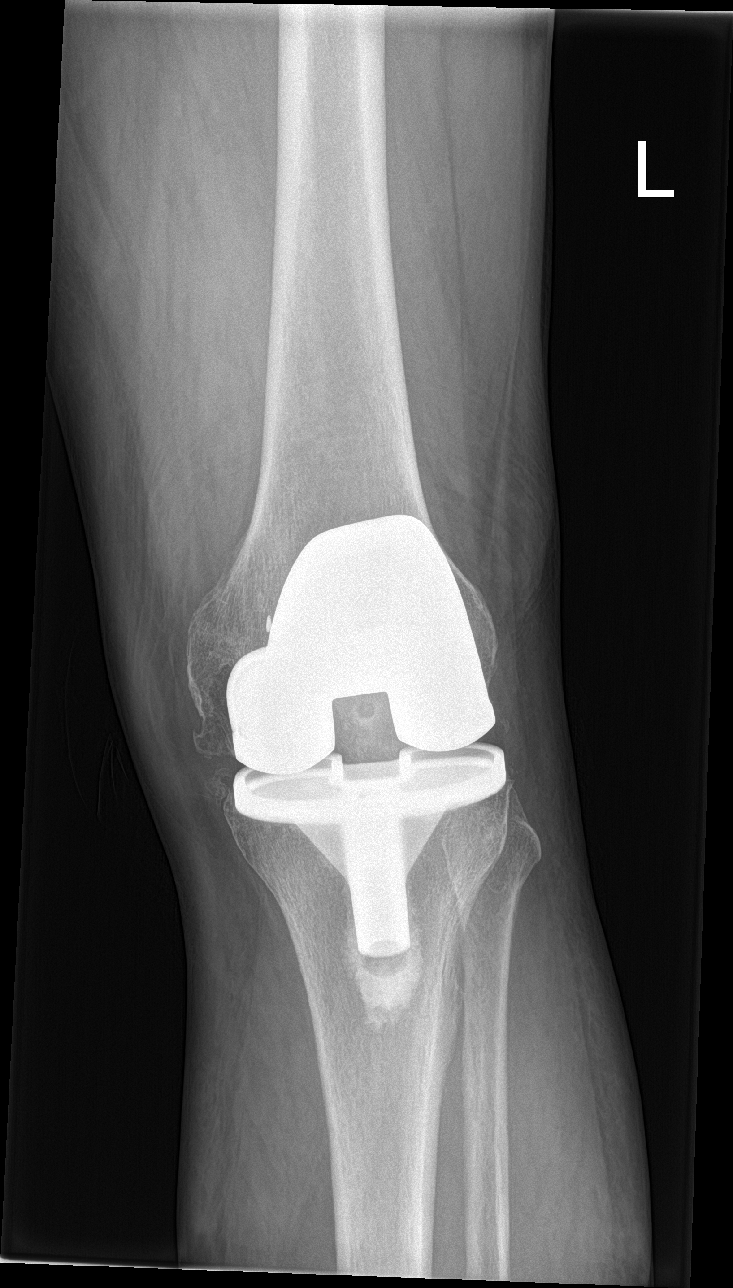

[knee lat]
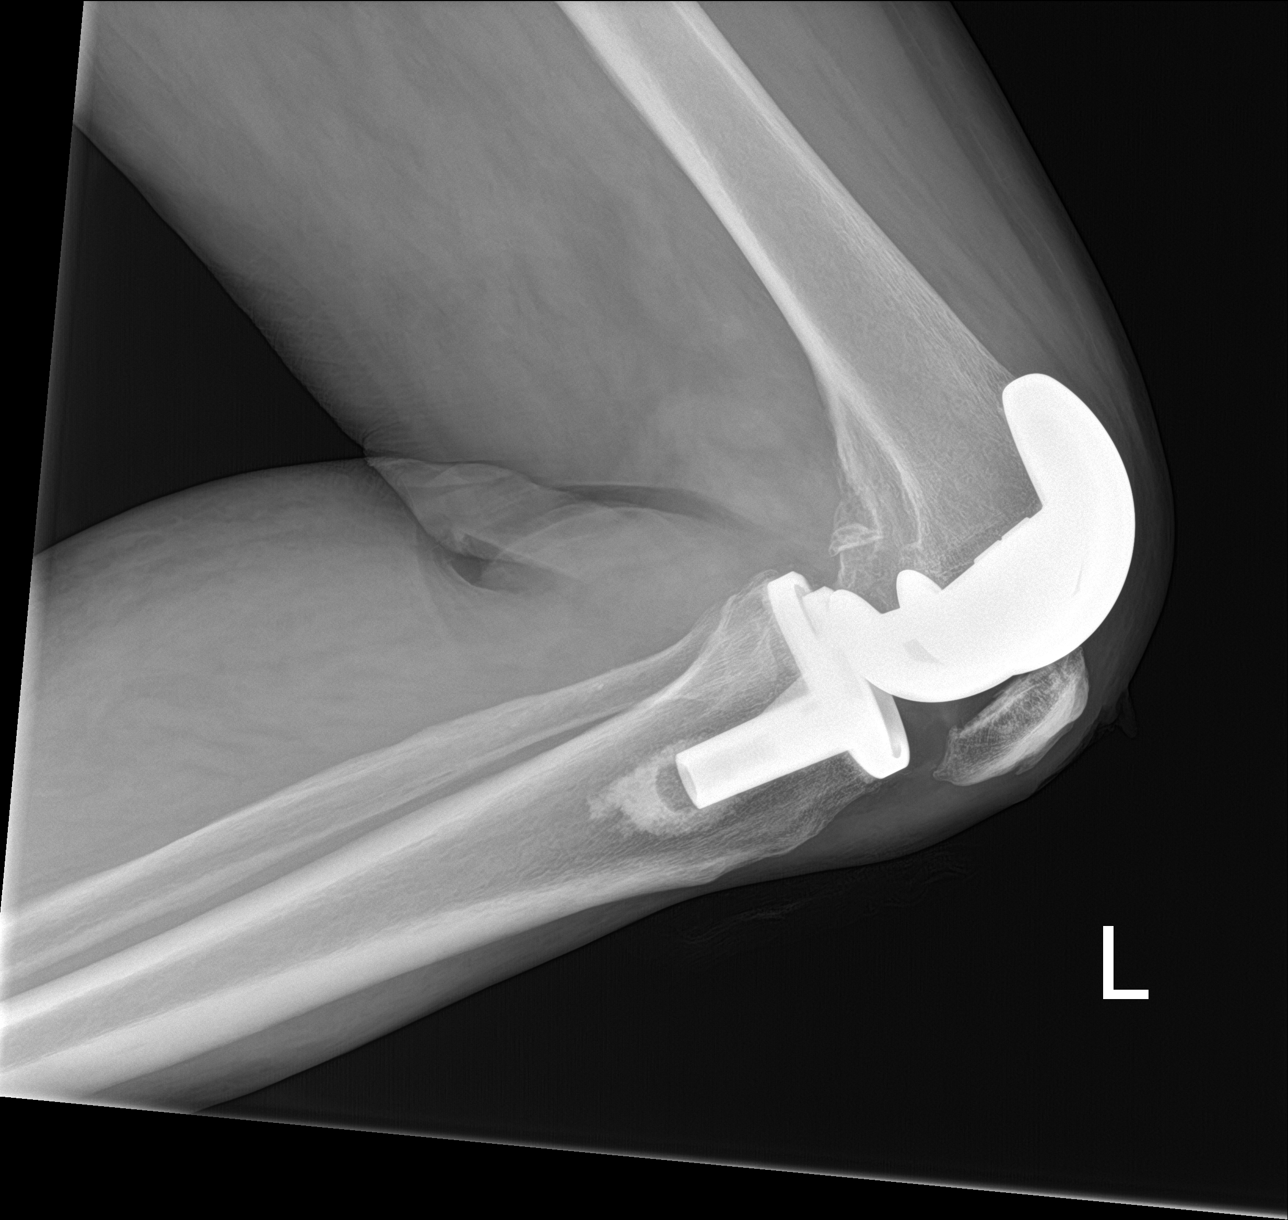

[knee obl (1 of 2)]
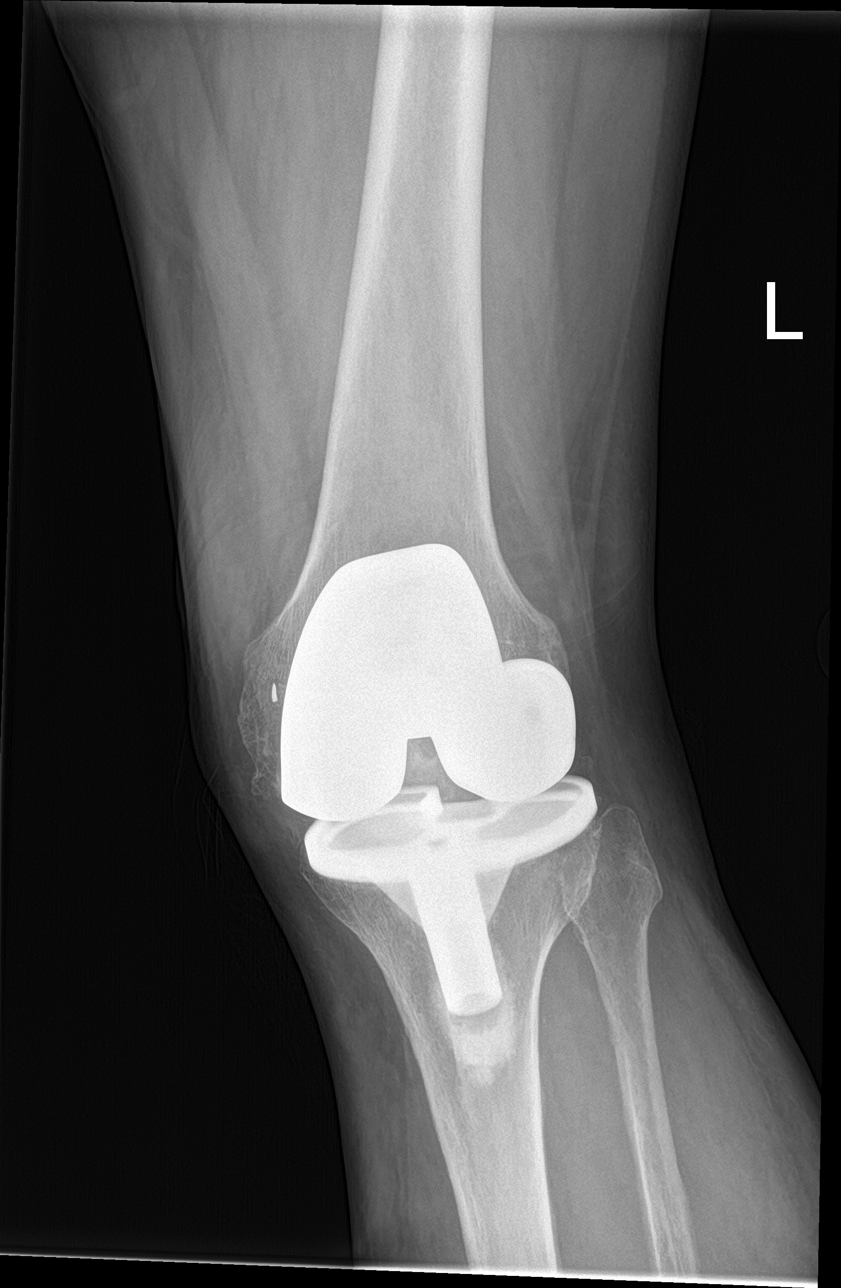

[knee obl (2 of 2)]
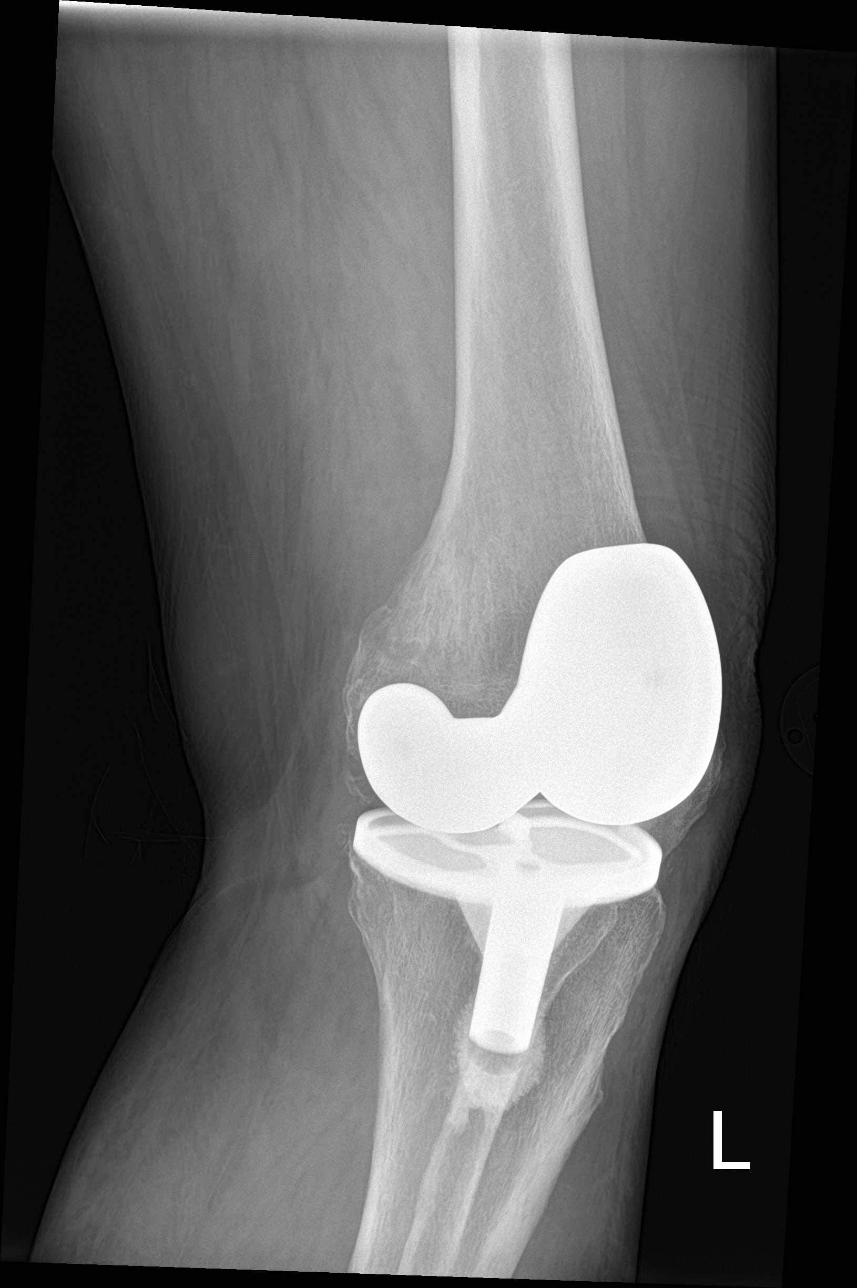

[4 of 4 positions shown; findings below may reference images not displayed]

FINDINGS: There is a total left knee arthroplasty in anatomic alignment. There
is no evidence of hardware loosening. The bones are osteopenic.
There is no acute fracture or dislocation. No joint effusion. The
soft tissues are unremarkable.
IMPRESSION: No acute fracture or dislocation.

Total knee arthroplasty in anatomic alignment.
